# Patient Record
Sex: Female | Born: 2000 | Race: White | Hispanic: No | Marital: Single | Smoking: Never smoker
Health system: Southern US, Community
[De-identification: ages and names within clinical notes are randomized; demographics above are authoritative.]

## PROBLEM LIST (undated history)

## (undated) HISTORY — PX: NASAL FRACTURE SURGERY: SHX718

## (undated) HISTORY — PX: WISDOM TOOTH EXTRACTION: SHX21

---
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
## 2024-04-30 ENCOUNTER — Other Ambulatory Visit: Payer: Self-pay | Admitting: Otolaryngology

## 2024-06-04 ENCOUNTER — Encounter: Payer: Self-pay | Admitting: Otolaryngology

## 2024-06-08 ENCOUNTER — Encounter: Payer: Self-pay | Admitting: Otolaryngology

## 2024-06-08 ENCOUNTER — Other Ambulatory Visit: Payer: Self-pay

## 2024-06-08 ENCOUNTER — Ambulatory Visit: Payer: Self-pay | Admitting: Anesthesiology

## 2024-06-08 ENCOUNTER — Encounter: Admission: RE | Disposition: A | Payer: Self-pay | Source: Home / Self Care | Attending: Otolaryngology

## 2024-06-08 ENCOUNTER — Ambulatory Visit
Admission: RE | Admit: 2024-06-08 | Discharge: 2024-06-08 | Disposition: A | Payer: Self-pay | Attending: Otolaryngology | Admitting: Otolaryngology

## 2024-06-08 HISTORY — PX: TONSILLECTOMY: SHX5217

## 2024-06-08 LAB — POCT PREGNANCY, URINE: Preg Test, Ur: NEGATIVE

## 2024-06-08 SURGERY — TONSILLECTOMY
Anesthesia: General | Laterality: Bilateral

## 2024-06-08 MED ORDER — DEXMEDETOMIDINE HCL IN NACL 80 MCG/20ML IV SOLN
INTRAVENOUS | Status: DC | PRN
  Administered 2024-06-08: 8 ug via INTRAVENOUS
  Administered 2024-06-08: 12 ug via INTRAVENOUS

## 2024-06-08 MED ORDER — LIDOCAINE HCL (PF) 2 % IJ SOLN
INTRAMUSCULAR | Status: AC
  Filled 2024-06-08: qty 5

## 2024-06-08 MED ORDER — ACETAMINOPHEN 10 MG/ML IV SOLN: 1000.0000 mg | Freq: Once | INTRAVENOUS | Status: DC | PRN

## 2024-06-08 MED ORDER — OXYCODONE HCL 5 MG/5ML PO SOLN
ORAL | Status: AC
  Filled 2024-06-08: qty 5

## 2024-06-08 MED ORDER — MIDAZOLAM HCL 2 MG/2ML IJ SOLN
INTRAMUSCULAR | Status: AC
  Filled 2024-06-08: qty 2

## 2024-06-08 MED ORDER — PREDNISOLONE SODIUM PHOSPHATE 15 MG/5ML PO SOLN: ORAL | 0 refills | Status: AC

## 2024-06-08 MED ORDER — OXYCODONE HCL 5 MG PO TABS: 5.0000 mg | ORAL_TABLET | Freq: Once | ORAL | Status: AC | PRN

## 2024-06-08 MED ORDER — PROPOFOL 10 MG/ML IV BOLUS
INTRAVENOUS | Status: DC | PRN
  Administered 2024-06-08: 200 mg via INTRAVENOUS
  Administered 2024-06-08: 100 mg via INTRAVENOUS

## 2024-06-08 MED ORDER — HYDROCODONE-ACETAMINOPHEN 7.5-325 MG/15ML PO SOLN: ORAL | 0 refills | Status: AC

## 2024-06-08 MED ORDER — METHYLPREDNISOLONE SODIUM SUCC 125 MG IJ SOLR
INTRAMUSCULAR | Status: DC | PRN
  Administered 2024-06-08: 40 mg via INTRAVENOUS

## 2024-06-08 MED ORDER — OXYCODONE HCL 5 MG/5ML PO SOLN
5.0000 mg | Freq: Once | ORAL | Status: AC | PRN
  Administered 2024-06-08: 5 mg via ORAL

## 2024-06-08 MED ORDER — MIDAZOLAM HCL 5 MG/5ML IJ SOLN
INTRAMUSCULAR | Status: DC | PRN
  Administered 2024-06-08: 2 mg via INTRAVENOUS

## 2024-06-08 MED ORDER — BUPIVACAINE HCL 0.25 % IJ SOLN
INTRAMUSCULAR | Status: DC | PRN
  Administered 2024-06-08: 2 mL

## 2024-06-08 MED ORDER — OXYMETAZOLINE HCL 0.05 % NA SOLN
NASAL | Status: DC | PRN
  Administered 2024-06-08: 1 via TOPICAL

## 2024-06-08 MED ORDER — DEXMEDETOMIDINE HCL IN NACL 80 MCG/20ML IV SOLN
INTRAVENOUS | Status: AC
  Filled 2024-06-08: qty 20

## 2024-06-08 MED ORDER — SUCCINYLCHOLINE CHLORIDE 200 MG/10ML IV SOSY
PREFILLED_SYRINGE | INTRAVENOUS | Status: DC | PRN
  Administered 2024-06-08: 100 mg via INTRAVENOUS

## 2024-06-08 MED ORDER — ONDANSETRON HCL 4 MG/2ML IJ SOLN
INTRAMUSCULAR | Status: AC
  Filled 2024-06-08: qty 2

## 2024-06-08 MED ORDER — FENTANYL CITRATE (PF) 100 MCG/2ML IJ SOLN
INTRAMUSCULAR | Status: DC | PRN
  Administered 2024-06-08 (×2): 50 ug via INTRAVENOUS

## 2024-06-08 MED ORDER — FENTANYL CITRATE (PF) 50 MCG/ML IJ SOSY: 25.0000 ug | PREFILLED_SYRINGE | INTRAMUSCULAR | Status: DC | PRN

## 2024-06-08 MED ORDER — LIDOCAINE HCL (CARDIAC) PF 100 MG/5ML IV SOSY
PREFILLED_SYRINGE | INTRAVENOUS | Status: DC | PRN
  Administered 2024-06-08: 100 mg via INTRAVENOUS

## 2024-06-08 MED ORDER — LACTATED RINGERS IV SOLN: INTRAVENOUS | Status: DC

## 2024-06-08 MED ORDER — SODIUM CHLORIDE 0.9 % IV SOLN: INTRAVENOUS | Status: DC | PRN

## 2024-06-08 MED ORDER — DROPERIDOL 2.5 MG/ML IJ SOLN: 0.6250 mg | Freq: Once | INTRAMUSCULAR | Status: DC | PRN

## 2024-06-08 MED ORDER — FENTANYL CITRATE (PF) 100 MCG/2ML IJ SOLN
INTRAMUSCULAR | Status: AC
  Filled 2024-06-08: qty 2

## 2024-06-08 MED ORDER — ACETAMINOPHEN 10 MG/ML IV SOLN
INTRAVENOUS | Status: AC
  Filled 2024-06-08: qty 100

## 2024-06-08 MED ORDER — SUCCINYLCHOLINE CHLORIDE 200 MG/10ML IV SOSY
PREFILLED_SYRINGE | INTRAVENOUS | Status: AC
  Filled 2024-06-08: qty 10

## 2024-06-08 MED ORDER — METHYLPREDNISOLONE SODIUM SUCC 125 MG IJ SOLR
INTRAMUSCULAR | Status: AC
  Filled 2024-06-08: qty 2

## 2024-06-08 MED ORDER — ONDANSETRON HCL 4 MG/2ML IJ SOLN
INTRAMUSCULAR | Status: DC | PRN
  Administered 2024-06-08: 4 mg via INTRAVENOUS

## 2024-06-08 MED ORDER — ACETAMINOPHEN 10 MG/ML IV SOLN
INTRAVENOUS | Status: DC | PRN
  Administered 2024-06-08: 1000 mg via INTRAVENOUS

## 2024-06-08 SURGICAL SUPPLY — 17 items
BLADE ELECT COATED/INSUL 125 (ELECTRODE) ×2 IMPLANT
CANISTER SUCT 1200ML W/VALVE (MISCELLANEOUS) ×2 IMPLANT
CATH ROBINSON RED A/P 10FR (CATHETERS) ×2 IMPLANT
COAGULATOR SUCTION 6 10FR HC (MISCELLANEOUS) ×2 IMPLANT
ELECTRODE REM PT RTRN 9FT ADLT (ELECTROSURGICAL) ×2 IMPLANT
GLOVE PI ULTRA LF STRL 7.5 (GLOVE) ×2 IMPLANT
KIT TURNOVER KIT A (KITS) ×2 IMPLANT
NDL HYPO 25GX1X1/2 BEV (NEEDLE) IMPLANT
NS IRRIG 500ML POUR BTL (IV SOLUTION) ×2 IMPLANT
PACK ANTI FOG SOFT WIPE (PACKS) ×2 IMPLANT
PACK TONSIL AND ADENOID CUSTOM (PACKS) ×2 IMPLANT
PENCIL SMOKE EVACUATOR (MISCELLANEOUS) ×2 IMPLANT
SLEEVE SUCTION 125 (MISCELLANEOUS) ×2 IMPLANT
SOLUTION ANTFG W/FOAM PAD STRL (MISCELLANEOUS) ×2 IMPLANT
SPONGE TONSIL 1 RF SGL (DISPOSABLE) IMPLANT
STRAP BODY AND KNEE 60X3 (MISCELLANEOUS) ×2 IMPLANT
SYR 5ML LL (SYRINGE) IMPLANT

## 2024-06-08 NOTE — Anesthesia Postprocedure Evaluation (Signed)
 Anesthesia Post Note  Patient: Shari Davis  Procedure(s) Performed: TONSILLECTOMY (Bilateral)  Patient location during evaluation: PACU Anesthesia Type: General Level of consciousness: awake and alert Pain management: pain level controlled Vital Signs Assessment: post-procedure vital signs reviewed and stable Respiratory status: spontaneous breathing, nonlabored ventilation and respiratory function stable Cardiovascular status: blood pressure returned to baseline and stable Postop Assessment: no apparent nausea or vomiting Anesthetic complications: no   No notable events documented.   Last Vitals:  Vitals:   06/08/24 1220 06/08/24 1245  BP: (!) 92/50 108/71  Pulse: 99 (!) 109  Resp: 18 15  Temp: 36.6 C   SpO2: 96% 97%    Last Pain:  Vitals:   06/08/24 1245  TempSrc:   PainSc: 5                  Camellia Merilee Louder

## 2024-06-08 NOTE — Anesthesia Procedure Notes (Signed)
 Procedure Name: Intubation Date/Time: 06/08/2024 11:36 AM  Performed by: Dave Maus, CRNAPre-anesthesia Checklist: Patient identified, Emergency Drugs available, Suction available and Patient being monitored Patient Re-evaluated:Patient Re-evaluated prior to induction Oxygen Delivery Method: Circle system utilized Preoxygenation: Pre-oxygenation with 100% oxygen Induction Type: IV induction Ventilation: Mask ventilation without difficulty Laryngoscope Size: Mac and 3 Grade View: Grade I Tube type: Oral Rae Tube size: 6.5 mm Number of attempts: 1 Airway Equipment and Method: Oral airway Placement Confirmation: ETT inserted through vocal cords under direct vision, positive ETCO2 and breath sounds checked- equal and bilateral Secured at: 22 cm Tube secured with: Tape Dental Injury: Teeth and Oropharynx as per pre-operative assessment

## 2024-06-08 NOTE — Discharge Instructions (Signed)
T & A INSTRUCTION SHEET - [facility]  P. SCOTT BENNETT, MD  INFORMATION SHEET FOR A TONSILLECTOMY AND ADENDOIDECTOMY  About Your Tonsils and Adenoids The tonsils and adenoids are normal body tissues that are part of our immune system. They normally help to protect us against diseases that may enter our mouth and nose. However, sometimes the tonsils and/or adenoids become too large and obstruct our breathing, especially at night.  If either of these things happen it helps to remove the tonsils and adenoids in order to become healthier. The operation to remove the tonsils and adenoids is called a tonsillectomy and adenoidectomy.  The Location of Your Tonsils and Adenoids The tonsils are located in the back of the throat on both side and sit in a cradle of muscles. The adenoids are located in the roof of the mouth, behind the nose, and closely associated with the opening of the Eustachian tube to the ear.  Surgery on Tonsils and Adenoids A tonsillectomy and adenoidectomy is a short operation which takes about thirty minutes. This includes being put to sleep and being awakened. Tonsillectomies and adenoidectomies are performed at [facility] and may require observation period in the recovery room prior to going home. Children are required to remain in the recovery area for 45 minutes after surgery.  Following the Operation for a Tonsillectomy A cautery machine is used to control bleeding.  Bleeding from a tonsillectomy and adenoidectomy is minimal and postoperatively the risk of bleeding is approximately four percent, although this rarely life threatening.  After your tonsillectomy and adenoidectomy post-op care at home: 1. Our patients are able to go home the same day. You may be given prescriptions for pain medications and antibiotics, if indicated. 2. It is extremely important to remember that fluid intake is of utmost importance after a  tonsillectomy. The amount that you drink must be maintained in the postoperative period. A good indication of whether a child is getting enough fluid is whether his/her urine output is constant.  As long as children are urinating or wetting their diaper every 6 - 8 hours this is usually enough fluid intake.   3. Although rare, this is a risk of some bleeding in the first ten days after surgery. This usually occurs between day five and nine postoperatively. This risk of bleeding is approximately four percent.  If you or your child should have any bleeding you should remain calm and notify our office or go directly to the Emergency Room at [facility] where they will contact us. Our doctors are available seven days a week for notification. We recommend sitting up quietly in a chair, place an ice pack on the front of the neck and spitting out the blood gently until we are able to contact you. Adults should gargle gently with ice water and this may help stop the bleeding. If the bleeding does not stop after a short time, i.e. 10 to 15 minutes, or seems to be increasing again, please contact us or go to the hospital.   4. It is common for the pain to be worse at 5 - 7 days postoperatively. This occurs because the "scab" is peeling off and the mucous membrane (skin of the throat) is growing back where the tonsils were.   5. It is common for a low-grade fever, less than 102, during the first week after a tonsillectomy and adenoidectomy. It is usually due to not drinking enough   liquids, and we suggest your use liquid Tylenol (acetaminophen) or the pain medicine with Tylenol (acetaminophen) prescribed in order to keep your temperature below 102. Please follow the directions on the back of the bottle. 6. Do not take aspirin or any products that contain aspirin such as Bufferin, Anacin, Ecotrin, aspirin gum, Goodies, BC headache powders, etc., after a T&A because it can promote bleeding.  DO NOT TAKE  MOTRIN OR IBUPROFEN. Please check with our office before administering any other medication that may been prescribed by other doctors during the two-week post-operative period. 7. If you happen to look in the mirror or into your child's mouth you will see white/gray patches on the back of the throat.  This is what a scab looks like in the mouth and is normal after having a tonsillectomy and adenoidectomy. It will disappear once the tonsil area heals completely. However, it may cause a noticeable odor, and this too will disappear with time.     8. You or your child may experience ear pain after having a tonsillectomy and adenoidectomy. This is called referred pain and comes from the throat, but it is felt in the ears. Ear pain is quite common and expected. It will usually go away after ten days. There is usually nothing wrong with the ears, and it is primarily due to the healing area stimulating the nerve to the ear that runs along the side of the throat. Use either the prescribed pain medicine or Tylenol (acetaminophen) as needed.  9. The throat tissues after a tonsillectomy are obviously sensitive. Smoking around children who have had a tonsillectomy significantly increases the risk of bleeding.  DO NOT SMOKE! What to Expect Each Day  First Day at Home 1. Patients will be discharged home the same day.  2. Drink at least four glasses of liquid a day. Clear, cool liquids are recommended. Fruit juices containing citric acid are not recommended because they tend to cause pain. Carbonated beverages are allowed if you pour them from glass to glass to remove the bubbles as these tend to cause discomfort. Avoid alcoholic beverages.  3. Eat very soft foods such as soups, broth, jello, custard, pudding, ice cream, popsicles, applesauce, mashed potatoes, and in general anything that you can crush between your tongue and the roof of your mouth. Try adding Carnation Instant Breakfast Mix into your food for extra  calories. It is not uncommon to lose 5 to 10 pounds of fluid weight. The weight will be gained back quickly once you're feeling better and drinking more.  4. Sleep with your head elevated on two pillows for about three days to help decrease the swelling.  5. DO NOT SMOKE!  Day Two  1. Rest as much as possible. Use common sense in your activities.  2. Continue drinking at least four glasses of liquid per day.  3. Follow the soft diet.  4. Use your pain medication as needed.  Day Three  1. Advance your activity as you are able and continue to follow the previous day's suggestions.  Days Four Through Six  1. Advance your diet and begin to eat more solid foods such as chopped hamburger. 2. Advance your activities slowly. Children should be kept mostly around the house.  3. Not uncommonly, there will be more pain at this time. It is temporary, usually lasting a day or two.  Day Seven Through Ten  1. Most individuals by this time are able to return to work or school unless otherwise instructed.   Consider sending children back to school for a half day on the first day back. 

## 2024-06-08 NOTE — Anesthesia Preprocedure Evaluation (Addendum)
 Anesthesia Evaluation  Patient identified by MRN, date of birth, ID band Patient awake    Reviewed: Allergy & Precautions, H&P , NPO status , Patient's Chart, lab work & pertinent test results  Airway Mallampati: II  TM Distance: >3 FB Neck ROM: full    Dental no notable dental hx.    Pulmonary neg pulmonary ROS   Pulmonary exam normal        Cardiovascular negative cardio ROS Normal cardiovascular exam     Neuro/Psych negative neurological ROS  negative psych ROS   GI/Hepatic negative GI ROS, Neg liver ROS,,,  Endo/Other  negative endocrine ROS    Renal/GU      Musculoskeletal   Abdominal  (+) + obese  Peds  Hematology negative hematology ROS (+)   Anesthesia Other Findings Past Surgical History: No date: NASAL FRACTURE SURGERY No date: WISDOM TOOTH EXTRACTION  BMI    Body Mass Index: 29.85 kg/m      Reproductive/Obstetrics negative OB ROS                              Anesthesia Physical Anesthesia Plan  ASA: 1  Anesthesia Plan: General ETT   Post-op Pain Management: Ofirmev  IV (intra-op)   Induction: Intravenous  PONV Risk Score and Plan: 4 or greater and Ondansetron , Midazolam  and TIVA  Airway Management Planned: Oral ETT  Additional Equipment:   Intra-op Plan:   Post-operative Plan: Extubation in OR  Informed Consent: I have reviewed the patients History and Physical, chart, labs and discussed the procedure including the risks, benefits and alternatives for the proposed anesthesia with the patient or authorized representative who has indicated his/her understanding and acceptance.     Dental Advisory Given  Plan Discussed with: CRNA and Surgeon  Anesthesia Plan Comments:          Anesthesia Quick Evaluation

## 2024-06-08 NOTE — Transfer of Care (Signed)
 Immediate Anesthesia Transfer of Care Note  Patient: Shari Davis  Procedure(s) Performed: TONSILLECTOMY (Bilateral)  Patient Location: PACU  Anesthesia Type: General ETT  Level of Consciousness: awake, alert  and patient cooperative  Airway and Oxygen Therapy: Patient Spontanous Breathing and Patient connected to supplemental oxygen  Post-op Assessment: Post-op Vital signs reviewed, Patient's Cardiovascular Status Stable, Respiratory Function Stable, Patent Airway and No signs of Nausea or vomiting  Post-op Vital Signs: Reviewed and stable  Complications: No notable events documented.

## 2024-06-08 NOTE — Op Note (Signed)
 06/08/2024  12:11 PM    Shari Davis  [id number]   Pre-Op Diagnosis:  CHRONIC TONSILLITIS, TONSIL STONES  Post-op Diagnosis: CHRONIC TONSILLITIS, TONSIL STONES  Procedure: Tonsillectomy  Surgeon:  Blair Deward RAMAN., MD  Anesthesia:  General endotracheal  EBL:  Less than 25 cc  Complications:  None  Findings: 3+ cryptic tonsils. No significant adenoid tissue  Procedure: The patient was taken to the Operating Room and placed in the supine position.  After induction of general endotracheal anesthesia, the table was turned 90 degrees and the patient was draped in the usual fashion  with the eyes protected.  A mouth gag was inserted into the oral cavity to open the mouth, and examination of the oropharynx showed the uvula was non-bifid. The palate was palpated, and there was no evidence of submucous cleft. Examination of the nasopharynx showed no obstructing adenoids. The right tonsil was grasped with an Allis clamp and resected from the tonsillar fossa in the usual fashion with the Bovie. The left tonsil was resected in the same fashion. The Bovie was used to obtain hemostasis. Each tonsillar fossa was then carefully injected with 0.25% marcaine  , avoiding intravascular injection. The nose and throat were irrigated and suctioned to remove any  blood clot. The mouth gag was  removed with no evidence of active bleeding.  The patient was then returned to the anesthesiologist for awakening, and was taken to the Recovery Room in stable condition.  Cultures:  None.  Specimens:  Tonsils.  Disposition:   PACU to home  Plan: Soft, bland diet and push fluids. Take pain medications and prednisolone  as prescribed. No strenuous activity for 2 weeks. Follow-up in 3 weeks.  Deward RAMAN Blair 06/08/2024 12:11 PM

## 2024-06-08 NOTE — H&P (Signed)
 History and physical reviewed and will be scanned in later. No change in medical status reported by the patient or family, appears stable for surgery. All questions regarding the procedure answered, and patient (or family if a child) expressed understanding of the procedure. ? ?Sandi Mealy ?@TODAY @ ?

## 2024-06-11 LAB — SURGICAL PATHOLOGY
